# Patient Record
Sex: Male | Born: 1975 | Race: White | Hispanic: No | Marital: Married | Smoking: Current every day smoker
Health system: Southern US, Community
[De-identification: ages and names within clinical notes are randomized; demographics above are authoritative.]

---

## 2009-01-10 ENCOUNTER — Ambulatory Visit: Payer: Self-pay | Admitting: Diagnostic Radiology

## 2009-01-10 ENCOUNTER — Emergency Department (HOSPITAL_BASED_OUTPATIENT_CLINIC_OR_DEPARTMENT_OTHER): Admission: EM | Admit: 2009-01-10 | Discharge: 2009-01-10 | Payer: Self-pay | Admitting: Emergency Medicine

## 2009-04-26 ENCOUNTER — Ambulatory Visit: Payer: Self-pay | Admitting: Family Medicine

## 2009-04-26 DIAGNOSIS — K089 Disorder of teeth and supporting structures, unspecified: Secondary | ICD-10-CM | POA: Insufficient documentation

## 2009-08-07 ENCOUNTER — Ambulatory Visit: Payer: Self-pay | Admitting: Family Medicine

## 2009-08-07 DIAGNOSIS — M546 Pain in thoracic spine: Secondary | ICD-10-CM

## 2009-09-04 ENCOUNTER — Ambulatory Visit: Payer: Self-pay | Admitting: Family Medicine

## 2009-12-20 ENCOUNTER — Ambulatory Visit: Payer: Self-pay | Admitting: Family Medicine

## 2009-12-20 DIAGNOSIS — S9030XA Contusion of unspecified foot, initial encounter: Secondary | ICD-10-CM | POA: Insufficient documentation

## 2009-12-20 DIAGNOSIS — S99929A Unspecified injury of unspecified foot, initial encounter: Secondary | ICD-10-CM

## 2009-12-20 DIAGNOSIS — S8990XA Unspecified injury of unspecified lower leg, initial encounter: Secondary | ICD-10-CM

## 2009-12-20 DIAGNOSIS — S99919A Unspecified injury of unspecified ankle, initial encounter: Secondary | ICD-10-CM

## 2010-03-13 ENCOUNTER — Ambulatory Visit: Payer: Self-pay | Admitting: Family Medicine

## 2010-03-13 DIAGNOSIS — IMO0002 Reserved for concepts with insufficient information to code with codable children: Secondary | ICD-10-CM | POA: Insufficient documentation

## 2010-03-13 DIAGNOSIS — M25519 Pain in unspecified shoulder: Secondary | ICD-10-CM | POA: Insufficient documentation

## 2010-03-19 ENCOUNTER — Ambulatory Visit: Payer: Self-pay | Admitting: Family Medicine

## 2010-03-19 ENCOUNTER — Encounter (INDEPENDENT_AMBULATORY_CARE_PROVIDER_SITE_OTHER): Payer: Self-pay | Admitting: *Deleted

## 2010-03-19 DIAGNOSIS — M7580 Other shoulder lesions, unspecified shoulder: Secondary | ICD-10-CM

## 2010-05-21 ENCOUNTER — Ambulatory Visit: Payer: Self-pay | Admitting: Family Medicine

## 2010-05-21 DIAGNOSIS — IMO0002 Reserved for concepts with insufficient information to code with codable children: Secondary | ICD-10-CM

## 2010-05-23 ENCOUNTER — Telehealth (INDEPENDENT_AMBULATORY_CARE_PROVIDER_SITE_OTHER): Payer: Self-pay | Admitting: *Deleted

## 2010-10-08 NOTE — Progress Notes (Signed)
  Phone Note Outgoing Call   Call placed by: Lajean Saver RN,  May 23, 2010 4:52 PM Call placed to: Patient Action Taken: Phone Call Completed  Follow-up for Phone Call        Phone call completed Patient says he is feeling a lot better and has been doing ROM exercises from the handout. Follow-up by: Lajean Saver RN,  May 23, 2010 4:53 PM

## 2010-10-08 NOTE — Letter (Signed)
Summary: Out of Work  [facility]  [address]   [place], [place] [address]   Phone: [phone]  Fax: [phone]    March 19, 2010   Employee:  Noah Robertson    To Whom It May Concern:   For Medical reasons, please excuse the above named employee from work for the following dates:  Start:  March 19, 2010 2:45 PM   End:  March 23, 2010   If you need additional information, please feel free to contact our office.         Sincerely,    Lillia Pauls CMA

## 2010-10-08 NOTE — Assessment & Plan Note (Signed)
Summary: INJURY TO R FOOT/KH   Vital Signs:  Patient Profile:   35 Years Old Male CC:      injury to right foot today while moving vanity Height:     70 inches Weight:      161 pounds O2 Sat:      98 % O2 treatment:    Room Air Temp:     97.5 degrees F oral Pulse rate:   74 / minute Pulse rhythm:   regular Resp:     12 per minute BP sitting:   126 / 77  (right arm) Cuff size:   regular  Pt. in pain?   yes    Location:   right foot    Intensity:   8    Type:       dull  Vitals Entered By: Lajean Saver RN (December 20, 2009 3:40 PM)                   Updated Prior Medication List: No Medications Current Allergies: No known allergies History of Present Illness Chief Complaint: injury to right foot today while moving vanity History of Present Illness: DROPPED A VANITY ON THE TOP OF HIS RIGHT FOOT PTA. PAIN AND SWELLING. NO SELF TX.   REVIEW OF SYSTEMS Constitutional Symptoms      Denies fever, chills, night sweats, weight loss, weight gain, and fatigue.  Eyes       Denies change in vision, eye pain, eye discharge, glasses, contact lenses, and eye surgery. Ear/Nose/Throat/Mouth       Denies hearing loss/aids, change in hearing, ear pain, ear discharge, dizziness, frequent runny nose, frequent nose bleeds, sinus problems, sore throat, hoarseness, and tooth pain or bleeding.  Respiratory       Denies dry cough, productive cough, wheezing, shortness of breath, asthma, bronchitis, and emphysema/COPD.  Cardiovascular       Denies murmurs, chest pain, and tires easily with exhertion.    Gastrointestinal       Denies stomach pain, nausea/vomiting, diarrhea, constipation, blood in bowel movements, and indigestion. Genitourniary       Denies painful urination, kidney stones, and loss of urinary control. Neurological       Complains of tingling.      Denies paralysis, seizures, and fainting/blackouts.      Comments: right foot Musculoskeletal       Complains of muscle pain,  joint pain, joint stiffness, decreased range of motion, and redness.      Denies swelling, muscle weakness, and gout.      Comments: right foot Skin       Denies bruising, unusual mles/lumps or sores, and hair/skin or nail changes.  Psych       Denies mood changes, temper/anger issues, anxiety/stress, speech problems, depression, and sleep problems. Other Comments: Injury to right today. Moving vanity that fell onto foot. R foot is tingling and he has decreased sensation in first two toes   Past History:  Past Medical History: Reviewed history from 04/26/2009 and no changes required. Unremarkable  Past Surgical History: Reviewed history from 04/26/2009 and no changes required. Denies surgical history  Family History: Reviewed history from 04/26/2009 and no changes required. 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 NO DEFORMITY. BRUISING NOTED DORSUM OF THE MID FOOT. SENSORY INTACT. GOOD CAP REFILL. GOOD ROM  OF TOES ,FOOT AND ANKLE.  Skin: no obvious rashes or lesions XRAY NEG FOR FRACTURE Assessment New Problems: CONTUSION, RIGHT FOOT (ICD-924.20) FOOT INJURY, RIGHT (ICD-959.7)   Plan New Medications/Changes: TYLENOL WITH CODEINE #3 300-30 MG TABS (ACETAMINOPHEN-CODEINE) 1-2 by mouth Q 6 HRS as needed PAIN  ##20 x 0, 12/20/2009, Marvis Moeller DO  New Orders: T-DG Foot Complete*R* [73630] Est. Patient Level III [[address]]   Prescriptions: TYLENOL WITH CODEINE #3 300-30 MG TABS (ACETAMINOPHEN-CODEINE) 1-2 by mouth Q 6 HRS as needed PAIN  ##20 x 0   Entered and Authorized by:   Marvis Moeller DO   Signed by:   Marvis Moeller DO on 12/20/2009   Method used:   Print then Give to Patient   RxID:   [id number]   Patient Instructions: 1)  WEAR  ACE WRAP FOR 3-4 DAYS. ELEVATE A AND APPLY ICE. FOLLOW UP WITH YOUR PCP OR ORTHO IF SYMPTOMS PERSIST.

## 2010-10-08 NOTE — Letter (Signed)
Summary: Out of Work  [facility]  [address]   [place], [place] [address]   Phone: [phone]  Fax: [phone]    May 21, 2010   Employee:  Noah Robertson    To Whom It May Concern:   For Medical reasons, please excuse the above named employee from heavy lifting, pushing, pulling, and straining for one week.    If you need additional information, please feel free to contact our office.         Sincerely,    Donna Christen MD

## 2010-10-08 NOTE — Assessment & Plan Note (Signed)
Summary: BACK PAIN/KH Room 5   Vital Signs:  Patient Profile:   35 Years Old Male CC:      Lower rt back pain, bending over pulling on shoe yesterday am Height:     70 inches Weight:      150 pounds O2 Sat:      99 % O2 treatment:    Room Air Temp:     98.7 degrees F oral Pulse rate:   90 / minute Pulse rhythm:   regular Resp:     16 per minute BP sitting:   115 / 74  (right arm) Cuff size:   regular  Vitals Entered By: Emilio Math (May 21, 2010 9:58 AM)                  Current Allergies (reviewed today): No known allergies History of Present Illness Chief Complaint: Lower rt back pain, bending over pulling on shoe yesterday am History of Present Illness:  Subjective:  Patient states that he bent down yesterday to pull on his left shoe and felt a sudden sharp pain in his right posterior chest radiating to his left shoulder blade.  The pain has persisted, and is worse with movement and inspiration.  No shortness of breath.  No fevers, chills, and sweats.  The pain does not radiate.  It awakens him at night.  No shoulder pain.  REVIEW OF SYSTEMS Constitutional Symptoms      Denies fever, chills, night sweats, weight loss, weight gain, and fatigue.  Eyes       Denies change in vision, eye pain, eye discharge, glasses, contact lenses, and eye surgery. Ear/Nose/Throat/Mouth       Denies hearing loss/aids, change in hearing, ear pain, ear discharge, dizziness, frequent runny nose, frequent nose bleeds, sinus problems, sore throat, hoarseness, and tooth pain or bleeding.  Respiratory       Denies dry cough, productive cough, wheezing, shortness of breath, asthma, bronchitis, and emphysema/COPD.  Cardiovascular       Denies murmurs, chest pain, and tires easily with exhertion.    Gastrointestinal       Denies stomach pain, nausea/vomiting, diarrhea, constipation, blood in bowel movements, and indigestion. Genitourniary       Denies painful urination, kidney stones,  and loss of urinary control. Neurological       Denies paralysis, seizures, and fainting/blackouts. Musculoskeletal       Complains of muscle pain, joint stiffness, and decreased range of motion.      Denies joint pain, redness, swelling, muscle weakness, and gout.  Skin       Denies bruising, unusual mles/lumps or sores, and hair/skin or nail changes.  Psych       Denies mood changes, temper/anger issues, anxiety/stress, speech problems, depression, and sleep problems.  Past History:  Past Medical History: Reviewed history from 03/19/2010 and no changes required. remote history of hand fracture  Past Surgical History: Reviewed history from 04/26/2009 and no changes required. Denies surgical history  Family History: Reviewed history from 04/26/2009 and no changes required. noncontributory  Social History: Reviewed history from 12/20/2009 and no changes required. 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  Lungs:  Clear to auscultation.  Breath sounds are equal.  Heart:  Regular rate and rhythm without murmurs, rubs, or gallops.  Abdomen:  Nontender without masses  or hepatosplenomegaly.  Bowel sounds are present.  No CVA or flank tenderness.  Posterior chest:  Diffuse mild tenderness right posterior/lateral ribs extending to left posterior ribs and along medial edge of left scapula.  No distinct point tenderness.  No lower back tenderness  Assessment New Problems: STRAIN, CHEST WALL (ICD-848.8)  STRAIN OF LATISSIMUS DORSI MUSCLES (BILATERAL) AND LEFT RHOMBOID STRAIN  Plan New Medications/Changes: LORTAB 5 5-500 MG TABS (HYDROCODONE-ACETAMINOPHEN) One by mouth hs as needed pain  #8 (eight) x 0, 05/21/2010, Donna Christen MD CYCLOBENZAPRINE HCL 10 MG TABS (CYCLOBENZAPRINE HCL) One tab by mouth two to three  times daily as needed  #15 x 1, 05/21/2010, Donna Christen MD NAPROXEN 500 MG TABS (NAPROXEN) One by mouth two times a [place] pc  #20 x 1, 05/21/2010, Donna Christen MD  New Orders: Est. Patient Level III [phone] Planning Comments:   Begin Naprosyn, muscle relaxant, and analgesic at bedtime.  Begin applying ice pack several times daily.  Begin range of motion exercises in about 5 days ([employer] information and instruction patient handout given)  No heavy lifting or strenuous activity for one week. Follow-up with PCP if not improving 10 to 14 days.   The patient and/or caregiver has been counseled thoroughly with regard to medications prescribed including dosage, schedule, interactions, rationale for use, and possible side effects and they verbalize understanding.  Diagnoses and expected course of recovery discussed and will return if not improved as expected or if the condition worsens. Patient and/or caregiver verbalized understanding.  Prescriptions: LORTAB 5 5-500 MG TABS (HYDROCODONE-ACETAMINOPHEN) One by mouth hs as needed pain  #8 (eight) x 0   Entered and Authorized by:   Donna Christen MD   Signed by:   Donna Christen MD on 05/21/2010   Method used:   Print then Give to Patient   RxID:   [id number] CYCLOBENZAPRINE HCL 10 MG TABS (CYCLOBENZAPRINE HCL) One tab by mouth two to three times daily as needed  #15 x 1   Entered and Authorized by:   Donna Christen MD   Signed by:   Donna Christen MD on 05/21/2010   Method used:   Print then Give to Patient   RxID:   [id number] NAPROXEN 500 MG TABS (NAPROXEN) One by mouth two times a [place] pc  #20 x 1   Entered and Authorized by:   Donna Christen MD   Signed by:   Donna Christen MD on 05/21/2010   Method used:   Print then Give to Patient   RxID:   [id number]   Orders Added: 1)  Est. Patient Level III [[address]]

## 2010-10-08 NOTE — Letter (Signed)
Summary: Out of Work  [facility]  [address]   [place], [place] [address]   Phone: [phone]  Fax: [phone]    March 13, 2010   Employee:  Noah Robertson    To Whom It May Concern:   For Medical reasons, the above named employee should not use his left arm/shoulder for one week, and wear a sling while at work.    If you need additional information, please feel free to contact our office.         Sincerely,    Donna Christen MD

## 2010-10-08 NOTE — Assessment & Plan Note (Signed)
Summary: LEFT SHOULDER ROTAR CUFF INJURY/MJD   Vital Signs:  Patient profile:   35 year old male Height:      70 inches Weight:      150 pounds BMI:     21.60 BP sitting:   128 / 88  Vitals Entered By: Lillia Pauls CMA (March 19, 2010 1:56 PM)  History of Present Illness: the patient is a pleasant 35 year old white male who presents after initial injury to his left shoulder, seen in consultation for Dr. Cathren Harsh. The patient reports this occurred while he was working and moving for [employer], and subsequently was assisting in moving some equipment, and his partner lost his handle on the movement, and he had a downward and outward motion causing deceleration with his left upper extremity.  He did not have a great deal of pain at the initial incident, and had a dull ache and some pain for about a week after this. Since that time, he is continued to have a dull aching pain in his upper extremity, and has noted that this was much worse over the last week or so. He did go in for evaluation on March 13, 2010. initial injury was approximately 3 weeks prior this.  He has had pain with abduction, pain with internal rotation. Does feel strong blows nose, and is actually able easily chip a golf ball. He does have pain at the long drives on the golf course. He has no pain with putting a ball. He does primarily complain of lateral shoulder pain and deep pain at the lateral anterior shoulder.  No prior history of dislocation event or prior traumatic fracture.  Allergies: No Known Drug Allergies  Past History:  Past medical, surgical, family and social histories (including risk factors) reviewed, and no changes noted (except as noted below).  Past Medical History: remote history of hand fracture  Past Surgical History: Reviewed history from 04/26/2009 and no changes required. Denies surgical history  Family History: Reviewed history from 04/26/2009 and no changes required. noncontributory  Social  History: Reviewed history from 12/20/2009 and no changes required. 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 Otherwise the pertinent positives of the ROS are noted above.    Physical Exam  General:  GEN: Well-developed,well-nourished,in no acute distress; alert,appropriate and cooperative throughout examination HEENT: Normocephalic and atraumatic without obvious abnormalities. No apparent alopecia or balding. Ears, externally no deformities PULM: Breathing comfortably in no respiratory distress EXT: No clubbing, cyanosis, or edema PSYCH: Normally interactive. Cooperative during the interview. Pleasant. Friendly and conversant. Not anxious or depressed appearing. Normal, full affect.  Msk:  less shoulder: Full range of motion without any mechanical block in all planes.  Strength testing of abduction is 4+/5, external rotation is 4+/5, internal rotation 5/5, and all of the strength testing is 5/5 with an excellent grip strength.  Negative drop test. There is no elevation of the scapula in abduction.  Painful arc of motion. Positive Jobe test. Mildly positive a.c. crossover compression test. Positive Neer test. Positive Leanord Asal test.  Minimally positive speed test with a negative Yergason's test. Negative O'Brien's test.  Nontender longer clavicle with minimal tenderness at the a.c. joint and some mild tenderness at the supraspinatus insertion.  Impression & Recommendations:  Problem # 1:  SHOULDER IMPINGEMENT SYNDROME, LEFT (ICD-726.2)  clinically not consistent with a full-thickness rotator cuff tear. I think that the patient likely had a small partial tear of the  infraspinatus with possibly minimal tear the supraspinatus, however function is intact. The vast majority of these will do well with conservative management.  I can initiate rotator cuff strengthening and scapular stabilization for biomechanical control and improvement.  Also will plan to do a subacromial injection for pain control and to help initiate therapy now.  I did keep him out of work until Saturday, so that he can get more pain free after his injection and start doing some rehabilitation and improved strengthening prior to returning to work.  cc: Dr. Cathren Harsh, I appreciate the opportunity to care for this very nice gentleman.  SubAC Injection, L Verbal consent was obtained from the patient. Risks, benefits, and alternatives were explained. Patient prepped with Betadine and Ethyl Chloride used for anesthesia. The subacromial space was injected using the posterior approach. The patient tolerated the procedure well and had decreased pain post injection. No complications. Injection: 9 cc of Lidocaine 1% and 1cc of Kenalog 40 mg. Needle: 22 gauge   Orders: Joint Aspirate / Injection, Large (20610)  Problem # 2:  SHOULDER STRAIN, LEFT (ICD-840.9)  Orders: Kenalog 10 mg inj (J3301) Theraband per yard (A9300) Joint Aspirate / Injection, Large (20610)  Problem # 3:  SHOULDER PAIN, LEFT (ICD-719.41)  His updated medication list for this problem includes:    Naproxen 500 Mg Tabs (Naproxen) ..... One by mouth two times a [place] pc    Lortab 5 5-500 Mg Tabs (Hydrocodone-acetaminophen) ..... One or two tabs by mouth hs as needed pain  Orders: Kenalog 10 mg inj (J3301) Theraband per yard (A9300) Joint Aspirate / Injection, Large (20610)  Complete Medication List: 1)  Naproxen 500 Mg Tabs (Naproxen) .... One by mouth two times a [place] pc 2)  Lortab 5 5-500 Mg Tabs (Hydrocodone-acetaminophen) .... One or two tabs by mouth hs as needed pain  Patient Instructions: 1)  f/u 1 month

## 2010-10-08 NOTE — Assessment & Plan Note (Signed)
Summary: left shoulder pain   Vital Signs:  Patient Profile:   35 Years Old Male CC:      left shoulder pain post injury 3 weeks ago Height:     70 inches Weight:      147 pounds O2 Sat:      100 % O2 treatment:    Room Air Temp:     97.1 degrees F oral Pulse rate:   86 / minute Resp:     14 per minute BP sitting:   100 / 741  (right arm) Cuff size:   regular  Pt. in pain?   yes    Location:   left shoulder    Intensity:   7    Type:       sharp/dull  Vitals Entered By: Lajean Saver RN (March 13, 2010 9:43 AM)                   Updated Prior Medication List: No Medications Current Allergies (reviewed today): No known allergies History of Present Illness Chief Complaint: left shoulder pain post injury 3 weeks ago History of Present Illness:  Subjective:  Patient complains of onset of sudden onset of left shoulder pain 3 weeks ago while lifting a front-loading clothes washing machine.  His helper dropped his side, and patient was left supporting all the weight of the machine.  Gaylyn Rong has had persistent pain, worse at night, and limited range of motion.  He also had tingling in his left fingers for several days, now resolved.  REVIEW OF SYSTEMS Constitutional Symptoms      Denies fever, chills, night sweats, weight loss, weight gain, and fatigue.  Eyes       Denies change in vision, eye pain, eye discharge, glasses, contact lenses, and eye surgery. Ear/Nose/Throat/Mouth       Denies hearing loss/aids, change in hearing, ear pain, ear discharge, dizziness, frequent runny nose, frequent nose bleeds, sinus problems, sore throat, hoarseness, and tooth pain or bleeding.  Respiratory       Denies dry cough, productive cough, wheezing, shortness of breath, asthma, bronchitis, and emphysema/COPD.  Cardiovascular       Denies murmurs, chest pain, and tires easily with exhertion.    Gastrointestinal       Denies stomach pain, nausea/vomiting, diarrhea, constipation, blood in bowel  movements, and indigestion. Genitourniary       Denies painful urination, kidney stones, and loss of urinary control. Neurological       Denies paralysis, seizures, and fainting/blackouts. Musculoskeletal       Complains of muscle pain, joint pain, joint stiffness, and decreased range of motion.      Denies redness, swelling, muscle weakness, and gout.      Comments: left shoulder Skin       Denies bruising, unusual mles/lumps or sores, and hair/skin or nail changes.  Psych       Denies mood changes, temper/anger issues, anxiety/stress, speech problems, depression, and sleep problems. Other Comments: patinet feels her "over stretched" his left shoulder moving a washing machine 3 weeks ago. He c/o intermittent pain, increasing with work. Decreased ROM, weakness   Past History:  Past Medical History: Reviewed history from 04/26/2009 and no changes required. Unremarkable  Past Surgical History: Reviewed history from 04/26/2009 and no changes required. Denies surgical history  Family History: Reviewed history from 04/26/2009 and no changes required. noncontributory  Social History: Reviewed history from 12/20/2009 and no changes required. 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  Has difficulty reaching behind back and cannot abduct above horizontal either actively or passively.  No deformity.  There is mild tenderness over AC joint and distal acromion.  Mild tenderness over insertion of biceps tendons.  Distal neurovascular intact  X-ray left shoulder:  negative X-ray left AC joint with and without weights:  negative Assessment New Problems: SHOULDER STRAIN, LEFT (ICD-840.9) SHOULDER PAIN, LEFT (ICD-719.41)  SUSPECT A ROTATOR CUFF  INJURY  Plan New Medications/Changes: LORTAB 5 5-500 MG TABS (HYDROCODONE-ACETAMINOPHEN) One or two tabs by mouth hs as needed pain  #12 (twelve) x 0, 03/13/2010, Donna Christen MD NAPROXEN 500 MG TABS (NAPROXEN) One by mouth two times a [place] pc  #20 x 1, 03/13/2010, Donna Christen MD  New Orders: T-DG Shoulder*L* [73030] T-DG AC Joints [73050] Slings- All Types [A4565] Est. Patient Level IV [14782] Planning Comments:   Will refer to the [facility] as soon as possible  Wear sling.  Apply ice pack several times daily.  NSAID, analgesic at bedtime.  Instructed in pendulum exercises until seen by sports med clinic.   The patient and/or caregiver has been counseled thoroughly with regard to medications prescribed including dosage, schedule, interactions, rationale for use, and possible side effects and they verbalize understanding.  Diagnoses and expected course of recovery discussed and will return if not improved as expected or if the condition worsens. Patient and/or caregiver verbalized understanding.  Prescriptions: LORTAB 5 5-500 MG TABS (HYDROCODONE-ACETAMINOPHEN) One or two tabs by mouth hs as needed pain  #12 (twelve) x 0   Entered and Authorized by:   Donna Christen MD   Signed by:   Donna Christen MD on 03/13/2010   Method used:   Print then Give to Patient   RxID:   [id number] NAPROXEN 500 MG TABS (NAPROXEN) One by mouth two times a [place] pc  #20 x 1   Entered and Authorized by:   Donna Christen MD   Signed by:   Donna Christen MD on 03/13/2010   Method used:   Print then Give to Patient   RxID:   [id number]   Orders Added: 1)  T-DG Shoulder*L* [73030] 2)  T-DG AC Joints [73050] 3)  Slings- All Types [A4565] 4)  Est. Patient Level IV [25366]

## 2012-08-09 ENCOUNTER — Emergency Department (INDEPENDENT_AMBULATORY_CARE_PROVIDER_SITE_OTHER): Payer: Self-pay

## 2012-08-09 ENCOUNTER — Encounter: Payer: Self-pay | Admitting: Emergency Medicine

## 2012-08-09 ENCOUNTER — Emergency Department
Admission: EM | Admit: 2012-08-09 | Discharge: 2012-08-09 | Disposition: A | Discharge status: Home / Self Care | Payer: Self-pay | Source: Home / Self Care | Attending: Family Medicine | Admitting: Family Medicine

## 2012-08-09 DIAGNOSIS — S2232XA Fracture of one rib, left side, initial encounter for closed fracture: Secondary | ICD-10-CM

## 2012-08-09 DIAGNOSIS — S2239XA Fracture of one rib, unspecified side, initial encounter for closed fracture: Secondary | ICD-10-CM

## 2012-08-09 DIAGNOSIS — W19XXXA Unspecified fall, initial encounter: Secondary | ICD-10-CM

## 2012-08-09 DIAGNOSIS — R079 Chest pain, unspecified: Secondary | ICD-10-CM

## 2012-08-09 DIAGNOSIS — S298XXA Other specified injuries of thorax, initial encounter: Secondary | ICD-10-CM

## 2012-08-09 MED ORDER — OXYCODONE-ACETAMINOPHEN 5-325 MG PO TABS: ORAL_TABLET | ORAL | Status: DC

## 2012-08-09 NOTE — ED Provider Notes (Signed)
History     CSN: [id number]  Arrival date & time 08/09/12  1122   First MD Initiated Contact with Patient 08/09/12 1214      Chief Complaint  Patient presents with  . Flank Pain      HPI Comments: Patient complains of left mid back rib pain from fall last night. Slipped and hit the corner of couch with left mid back ribs, unable to sleep at all, very painful.  He denies shortness of breath   Patient is a 36 y.o. male presenting with chest pain. The history is provided by the patient.  Chest Pain The chest pain began yesterday. Chest pain occurs frequently. The chest pain is unchanged. The pain is associated with breathing. At its most intense, the pain is at 10/10. The severity of the pain is moderate. The quality of the pain is described as sharp. The pain does not radiate. Chest pain is worsened by deep breathing. Pertinent negatives for primary symptoms include no fever, no fatigue, no syncope, no shortness of breath, no cough, no wheezing, no palpitations, no abdominal pain, no nausea and no vomiting.     History reviewed. No pertinent past medical history.  History reviewed. No pertinent past surgical history.  Family History  Problem Relation Age of Onset  . Heart disease Father     History  Substance Use Topics  . Smoking status: Current Every [place] Smoker -- 0.5 packs/[place] for 5 years  . Smokeless tobacco: Not on file  . Alcohol Use: Yes      Review of Systems  Constitutional: Negative for fever and fatigue.  Respiratory: Negative for cough, shortness of breath and wheezing.   Cardiovascular: Positive for chest pain. Negative for palpitations and syncope.  Gastrointestinal: Negative for nausea, vomiting and abdominal pain.  All other systems reviewed and are negative.    Allergies  Review of patient's allergies indicates no known allergies.  Home Medications   Current Outpatient Rx  Name  Route  Sig  Dispense  Refill  . OXYCODONE-ACETAMINOPHEN 5-325 MG PO  TABS      Take one by mouth HS prn pain   10 tablet   0     BP 99/61  Pulse 83  Temp 97.9 F (36.6 C) (Oral)  Resp 18  Ht 5\' 10"  (1.778 m)  Wt 146 lb (66.225 kg)  BMI 20.95 kg/m2  SpO2 99%  Physical Exam  Pulmonary/Chest:         There is tenderness over left posterior ribs as noted on diagram  Nursing notes and Vital Signs reviewed. Appearance:  Patient appears healthy, stated age, and in no acute distress Eyes:  Pupils are equal, round, and reactive to light and accomodation.  Extraocular movement is intact.  Conjunctivae are not inflamed   Pharynx:  Normal Neck:  Supple.  No adenopathy Lungs:  Clear to auscultation.  Breath sounds are equal.  Heart:  Regular rate and rhythm without murmurs, rubs, or gallops.  Abdomen:  Nontender without masses or hepatosplenomegaly.  Bowel sounds are present.  No CVA or flank tenderness.  Extremities:  No edema.  No calf tenderness Skin:  No rash present.    ED Course  Procedures  none   Dg Ribs Unilateral W/chest Left  08/09/2012  *RADIOLOGY REPORT*  Clinical Data: Fall with left chest injury and rib pain.  LEFT RIBS AND CHEST - 3+ VIEW  Comparison: None.  Findings: A subtle fracture is suspected involving the posterior aspect of the left ninth  rib.  No associated pneumothorax or pulmonary consolidation.  The heart size and mediastinal contours are within normal limits.  IMPRESSION: Suspect subtle left posterior 9th rib fracture.   Original Report Authenticated By: Irish Lack, M.D.      1. Fracture of rib of left side       MDM  Rib belt applied. Rx for Percocet at bedtime.  Apply ice pack several times daily. Red flags discussed (Return for increasing shortness of breath, fever, etc)        Lattie Haw, MD 08/10/12 1527

## 2012-08-09 NOTE — ED Notes (Signed)
Left mid back rib pain from fall last night. Slipped and hit the corner of couch with left mid back ribs, unable to sleep at all, very painful.

## 2012-08-14 ENCOUNTER — Telehealth: Payer: Self-pay | Admitting: Emergency Medicine

## 2013-04-18 ENCOUNTER — Emergency Department (INDEPENDENT_AMBULATORY_CARE_PROVIDER_SITE_OTHER)
Admission: EM | Admit: 2013-04-18 | Discharge: 2013-04-18 | Disposition: A | Discharge status: Home / Self Care | Payer: BC Managed Care – PPO | Source: Home / Self Care | Attending: Family Medicine | Admitting: Family Medicine

## 2013-04-18 ENCOUNTER — Emergency Department (INDEPENDENT_AMBULATORY_CARE_PROVIDER_SITE_OTHER): Payer: BC Managed Care – PPO

## 2013-04-18 ENCOUNTER — Encounter: Payer: Self-pay | Admitting: Emergency Medicine

## 2013-04-18 DIAGNOSIS — M5030 Other cervical disc degeneration, unspecified cervical region: Secondary | ICD-10-CM

## 2013-04-18 DIAGNOSIS — M4120 Other idiopathic scoliosis, site unspecified: Secondary | ICD-10-CM

## 2013-04-18 DIAGNOSIS — S20229A Contusion of unspecified back wall of thorax, initial encounter: Secondary | ICD-10-CM

## 2013-04-18 DIAGNOSIS — M5412 Radiculopathy, cervical region: Secondary | ICD-10-CM

## 2013-04-18 DIAGNOSIS — S20221A Contusion of right back wall of thorax, initial encounter: Secondary | ICD-10-CM

## 2013-04-18 DIAGNOSIS — S239XXA Sprain of unspecified parts of thorax, initial encounter: Secondary | ICD-10-CM

## 2013-04-18 DIAGNOSIS — M5380 Other specified dorsopathies, site unspecified: Secondary | ICD-10-CM

## 2013-04-18 DIAGNOSIS — S29012A Strain of muscle and tendon of back wall of thorax, initial encounter: Secondary | ICD-10-CM

## 2013-04-18 MED ORDER — HYDROCODONE-ACETAMINOPHEN 5-325 MG PO TABS: ORAL_TABLET | ORAL | Status: DC

## 2013-04-18 MED ORDER — PREDNISONE 20 MG PO TABS: 20.0000 mg | ORAL_TABLET | Freq: Two times a day (BID) | ORAL | Status: DC

## 2013-04-18 NOTE — ED Provider Notes (Signed)
CSN: [id number]     Arrival date & time 04/18/13  1349 History     First MD Initiated Contact with Patient 04/18/13 1357     Chief Complaint  Patient presents with  . Back Pain      HPI Comments: Patient was holding a 36-year old child in his arms 5 days ago.  He slipped backwards, striking the edge of a couch with his right shoulder before landing on floor.  He has had persistent right upper pain around his scapula.  He has developed persistent paresthesia in his right hand when he extends his right arm forward, and has difficulty manipulating ViseGrips, and other tools.  He denies neck pain.  Patient is a 37 y.o. male presenting with back pain. The history is provided by the patient.  Back Pain Location:  Thoracic spine Quality:  Aching Radiates to:  Does not radiate Pain severity:  Moderate Pain is:  Worse during the [place] Onset quality:  Sudden Duration:  5 days Timing:  Constant Progression:  Worsening Chronicity:  New Context: falling   Relieved by:  Nothing Exacerbated by: extending his right arm forward. Ineffective treatments:  None tried Associated symptoms: numbness, paresthesias, tingling and weakness   Associated symptoms: no chest pain and no headaches     No past medical history   No past surgical history    Family History  Problem Relation Age of Onset  . Heart disease Father    History  Substance Use Topics  . Smoking status: Current Every Helle Smoker -- 0.50 packs/Player for 5 years  . Smokeless tobacco: Not on file  . Alcohol Use: Yes    Review of Systems  Cardiovascular: Negative for chest pain.  Musculoskeletal: Positive for back pain.  Neurological: Positive for tingling, weakness, numbness and paresthesias. Negative for headaches.  All other systems reviewed and are negative.    Allergies  Review of patient's allergies indicates no known allergies.  Home Medications   Current Outpatient Rx  Name  Route  Sig  Dispense  Refill  .  HYDROcodone-acetaminophen (NORCO/VICODIN) 5-325 MG per tablet      Take one by mouth at bedtime as needed for pain   10 tablet   0   . oxyCODONE-acetaminophen (ROXICET) 5-325 MG per tablet      Take one by mouth HS prn pain   10 tablet   0   . predniSONE (DELTASONE) 20 MG tablet   Oral   Take 1 tablet (20 mg total) by mouth 2 (two) times daily.   10 tablet   0    BP 110/73  Pulse 83  Temp(Src) 98.2 F (36.8 C) (Oral)  Ht 5\' 10"  (1.778 m)  Wt 141 lb (63.957 kg)  BMI 20.23 kg/m2  SpO2 98% Physical Exam  Nursing note and vitals reviewed. Constitutional: He is oriented to person, place, and time. He appears well-developed and well-nourished. No distress.  HENT:  Head: Atraumatic.  Eyes: Conjunctivae are normal. Pupils are equal, round, and reactive to light.  Neck: Normal range of motion. Neck supple.  Cardiovascular: Normal heart sounds.   Pulmonary/Chest: Breath sounds normal.  Musculoskeletal:       Cervical back: He exhibits tenderness and bony tenderness. He exhibits no swelling, no edema and no deformity.       Thoracic back: He exhibits tenderness and bony tenderness. He exhibits no swelling and no edema.       Back:  Upper back and neck reveal tenderness in the midline  thoracic spine and inferior cervical spine as noted on diagram. There is distinct tenderness over medial and inferior edges of right scapula.  Pain elicited by resisted abduction of left shoulder while palpating left rhomboid muscles.  Sensation in the right hand is slightly decreased to light touch. Right shoulder is nontender and has relatively good range of motion   Lymphadenopathy:    He has no cervical adenopathy.  Neurological: He is alert and oriented to person, place, and time.  Skin: Skin is warm and dry. No rash noted.    ED Course   Procedures  none   Dg Cervical Spine Complete  04/18/2013   *RADIOLOGY REPORT*  Clinical Data: Pain post trauma  CERVICAL SPINE - COMPLETE 4+ VIEW   Comparison: None.  Findings: Frontal, lateral, open mouth odontoid, and bilateral oblique views were obtained. There is no fracture or spondylolisthesis.  Prevertebral soft tissues and predental space regions are normal.  There is moderate disc space narrowing at C6-7. Other disc spaces appear normal.  There are posterior osteophytes at C6 and C7. There is exit foraminal narrowing at the C6-7 on the right due to bony hypertrophy.     IMPRESSION: Prominent osteophytes at C6-7 on the right with disc narrowing at C6-7.  No fracture or spondylolisthesis.   Original Report Authenticated By: Bretta Bang, M.D.   Dg Thoracic Spine 2 View  04/18/2013   *RADIOLOGY REPORT*  Clinical Data: Pain post trauma  THORACIC SPINE - 3 VIEW  Comparison: No  Findings:  Frontal, lateral, and swimmer's views were obtained. There is thoracolumbar levoscoliosis.  There is no fracture or spondylolisthesis.  There is disc space narrowing in the lower cervical spine at several levels.  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 No fracture or spondylolisthesis.   Original Report Authenticated By: Bretta Bang, M.D.   1. Rhomboid muscle strain, initial encounter   2. Contusion of upper back excluding scapular region, right, initial encounter   3. Cervical radiculopathy at C6     MDM  Begin prednisone burst.  Rx for Lortab at bedtime. Apply ice pack to upper back 3 to 4 times daily until improved.  Begin exercises for rhomboid muscles as per instruction sheet (but avoid all exercises involving extension of the neck) ([facility] information and instruction handout given)  Followup with neurologist if not improving about 10 to 14 days.  Lattie Haw, MD 04/18/13 1536

## 2013-04-18 NOTE — ED Notes (Signed)
Patient C/O of rt Scapula pain from slipping on a toy while holding a child and falling hitting his back on corner of couch x 6 days ago.  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

## 2014-03-01 ENCOUNTER — Emergency Department (INDEPENDENT_AMBULATORY_CARE_PROVIDER_SITE_OTHER)
Admission: EM | Admit: 2014-03-01 | Discharge: 2014-03-01 | Disposition: A | Discharge status: Home / Self Care | Payer: BC Managed Care – PPO | Source: Home / Self Care | Attending: Family Medicine | Admitting: Family Medicine

## 2014-03-01 ENCOUNTER — Telehealth: Payer: Self-pay | Admitting: *Deleted

## 2014-03-01 ENCOUNTER — Ambulatory Visit (INDEPENDENT_AMBULATORY_CARE_PROVIDER_SITE_OTHER): Payer: BC Managed Care – PPO | Admitting: Sports Medicine

## 2014-03-01 ENCOUNTER — Encounter: Payer: Self-pay | Admitting: Emergency Medicine

## 2014-03-01 DIAGNOSIS — S71009A Unspecified open wound, unspecified hip, initial encounter: Secondary | ICD-10-CM

## 2014-03-01 DIAGNOSIS — S71109A Unspecified open wound, unspecified thigh, initial encounter: Secondary | ICD-10-CM

## 2014-03-01 DIAGNOSIS — S71132D Puncture wound without foreign body, left thigh, subsequent encounter: Secondary | ICD-10-CM

## 2014-03-01 DIAGNOSIS — S71112A Laceration without foreign body, left thigh, initial encounter: Secondary | ICD-10-CM | POA: Insufficient documentation

## 2014-03-01 DIAGNOSIS — Z299 Encounter for prophylactic measures, unspecified: Secondary | ICD-10-CM | POA: Insufficient documentation

## 2014-03-01 MED ORDER — CEPHALEXIN 500 MG PO CAPS: 500.0000 mg | ORAL_CAPSULE | Freq: Three times a day (TID) | ORAL | Status: DC

## 2014-03-01 MED ORDER — MELOXICAM 15 MG PO TABS: ORAL_TABLET | ORAL | Status: AC

## 2014-03-01 MED ORDER — METRONIDAZOLE 500 MG PO TABS: 500.0000 mg | ORAL_TABLET | Freq: Two times a day (BID) | ORAL | Status: AC

## 2014-03-01 MED ORDER — OXYCODONE HCL 7.5 MG PO TABS: 1.0000 | ORAL_TABLET | Freq: Three times a day (TID) | ORAL | Status: DC | PRN

## 2014-03-01 NOTE — Assessment & Plan Note (Signed)
Noah SalinaBenjamin does desire to establish primary care, we will do this at the next visit.

## 2014-03-01 NOTE — Assessment & Plan Note (Addendum)
Occurred 5 days ago. CT angiogram of the lower extremity was normal, there was some gas in the adductor longus muscle. He is in significant pain, oxycodone 5 mg for use 3 times a Salvas, Mobic. Strap of compressive dressing. Considering gas pockets we do need to consider anaerobic coverage, on further review of his chart he is already on Keflex which is appropriate coverage for aerobes, I will add metronidazole for additional coverage of anaerobes. Return visit next week for nurse visit to remove stitches, and then in 2 weeks to see me.

## 2014-03-01 NOTE — Progress Notes (Signed)
   Subjective:    I'm seeing this patient as a consultation for:  Dr. Cathren HarshBeese  CC: Left leg stab wound  HPI: This is a pleasant 38 year old male, he played [facility] [employer] in [place], proximally 5 days ago he accidentally stabbed himself in the left medial thigh. The entirety of a 4 inch blade went into the thigh, he pulled it out and help pressure on the bleeding. He went immediately to the emergency department where a CT angiogram was negative for any vascular injury, all that was noted was some gas in the adductor muscle. On further review of his chart it seems as though he was prescribed Keflex.  Past medical history, Surgical history, Family history not pertinant except as noted below, Social history, Allergies, and medications have been entered into the medical record, reviewed, and no changes needed.   Review of Systems: No headache, visual changes, nausea, vomiting, diarrhea, constipation, dizziness, abdominal pain, skin rash, fevers, chills, night sweats, weight loss, swollen lymph nodes, body aches, joint swelling, muscle aches, chest pain, shortness of breath, mood changes, visual or auditory hallucinations.   Objective:   General: Well Developed, well nourished, and in no acute distress.  Neuro/Psych: Alert and oriented x3, extra-ocular muscles intact, able to move all 4 extremities, sensation grossly intact. Skin: Warm and dry, no rashes noted.  Respiratory: Not using accessory muscles, speaking in full sentences, trachea midline.  Cardiovascular: Pulses palpable, no extremity edema. Abdomen: Does not appear distended. Left thigh: There is a well approximated, sutured, 1.5 inch laceration without erythema, induration, or drainage. There is significant tenderness to palpation along the adductor longus, without palpable masses, or warmth. I am able to reproduce his pain with resisted abduction and internal straight leg raise.  CT angiogram of the left leg  from [facility]  TECHNIQUE: CT arteriogram of the lower extremities. Intra-axial acquisition performed after administration of 100 cc Isovue-370. No comparison. Sagittal and coronal reconstructions evaluated on the workstation.  INDICATION: Stab wound to the left upper leg.  FINDINGS: A relatively small soft tissue defect is present in the medial left thigh with a small area of subcutaneous soft tissue density visible on image 132. There also several small air collections in the adductor longus. Most are quite superficial  but one or 2 are fairly deep. There is no evidence of injury to any significant arterial or venous structures. The closest major vessels to the site of injury of the superficial femoral artery and vein. Imaged vascular structures are all widely patent.  There are no other bony or soft tissue injuries. Visible pelvic organs are normal.  IMPRESSION: Soft tissue injury to the medial mid left thigh with small gas pockets in the adductor longus. No evidence of significant vascular injury.   Impression and Recommendations:   This case required medical decision making of moderate complexity.

## 2014-03-01 NOTE — ED Provider Notes (Signed)
CSN: [id number]     Arrival date & time 03/01/14  1022 History   First MD Initiated Contact with Patient 03/01/14 1048     Chief Complaint  Patient presents with  . Wound Check      HPI Comments: While opening a container with a pocket knife five days ago, patient accidentally stabbed his left upper inner thigh, penetrating to a depth of about two inches.  [facility] CT angio showed no vascular injury.  The CT did show small gas pockets in the adductor longus.  His wound was closed with sutures.  He was prescribed cephalexin, and oxycodone for pain.  He complains of continued pain in his left upper medial thigh, radiating to the left groin.  The pain is worse with adduction of his left thigh, and walking.  The pain persists despite taking oxycodone.  No drainage from his wound.  No fevers, chills, and sweats.  He feels well otherwise.  Patient is a 38 y.o. male presenting with wound check. The history is provided by the patient.  Wound Check This is a new problem. Episode onset: 5 days ago. The problem has not changed since onset.Pertinent negatives include no abdominal pain. Associated symptoms comments: Left groin pain. The symptoms are aggravated by walking. The symptoms are relieved by narcotics. He has tried nothing for the symptoms.    History reviewed. No pertinent past medical history. History reviewed. No pertinent past surgical history. Family History  Problem Relation Age of Onset  . Heart disease Father    History  Substance Use Topics  . Smoking status: Current Every Gr[place] Smoker -- 0.50 packs/[place] for 5 years  . Smokeless tobacco: Not on file  . Alcohol Use: No    Review of Systems  Constitutional: Negative for fever, chills and fatigue.  Gastrointestinal: Negative for abdominal pain.  All other systems reviewed and are negative.   Allergies  Review of patient's allergies indicates no known allergies.  Home Medications     Prior to Admission medications   Medication Sig Start Date End Date Taking? Authorizing Provider  UNKNOWN TO PATIENT    Yes Historical Provider, MD  cephALEXin (KEFLEX) 500 MG capsule Take 1 capsule (500 mg total) by mouth 3 (three) times daily. 03/01/14   Monica Bectonhomas J Thekkekandam, MD  HYDROcodone-acetaminophen (NORCO/VICODIN) 5-325 MG per tablet Take one by mouth at bedtime as needed for pain 04/18/13   Lattie HawStephen A Beese, MD  meloxicam (MOBIC) 15 MG tablet One tab PO qAM with breakfast for 2 weeks, then daily prn pain. 03/01/14   Monica Bectonhomas J Thekkekandam, MD  OxyCODONE HCl 7.5 MG TABA Take 1 tablet by mouth every 8 (eight) hours as needed. 03/01/14   Monica Bectonhomas J Thekkekandam, MD  predniSONE (DELTASONE) 20 MG tablet Take 1 tablet (20 mg total) by mouth 2 (two) times daily. 04/18/13   Lattie HawStephen A Beese, MD   BP 110/69  Pulse 70  Temp(Src) 97.6 F (36.4 C) (Oral)  Resp 18  SpO2 99% Physical Exam  Nursing note and vitals reviewed. Constitutional: He is oriented to person, place, and time. He appears well-developed and well-nourished. No distress.  HENT:  Head: Atraumatic.  Eyes: Conjunctivae are normal. Pupils are equal, round, and reactive to light.  Abdominal: There is no tenderness.  Musculoskeletal:       Left upper leg: He exhibits tenderness and laceration. He exhibits no swelling and no edema.       Legs: There is  a 2cm long healing sutured laceration left upper thigh as noted on diagram.  There is no drainage from wound.  Area surrounding wound is tender to palpation, and resolving ecchymosis is present.  No erythema, swelling, fluctuance, induration, or warmth.  Patient has pain with resisted adduction of the left hip.  Pain is elicited in the left inguinal area with resisted flexion of the left hip.    Neurological: He is alert and oriented to person, place, and time.  Skin: Skin is warm and dry.    ED Course  Procedures  none     MDM   1. Puncture wound of left thigh, subsequent  encounter    With history of deep puncture wound and persistent left thigh and groin pain, will refer to Dr. Rodney Langtonhomas Thekkekandam for further evaluation and management.    Lattie HawStephen A Beese, MD 03/01/14 [phone]

## 2014-03-01 NOTE — ED Notes (Addendum)
The pt is here for a wound check from his [facility] . He reports increased pain in his LT upper thigh since running out of his pain meds.

## 2014-03-01 NOTE — Discharge Instructions (Signed)
Sutured Wound Care °Sutures are stitches that can be used to close wounds. Wound care helps prevent pain and infection.  °HOME CARE INSTRUCTIONS  °· Rest and elevate the injured area until all the pain and swelling are gone. °· Only take over-the-counter or prescription medicines for pain, discomfort, or fever as directed by your caregiver. °· After 48 hours, gently wash the area with mild soap and water once a Roger, or as directed. Rinse off the soap. Pat the area dry with a clean towel. Do not rub the wound. This may cause bleeding. °· Follow your caregiver's instructions for how often to change the bandage (dressing). Stop using a dressing after 2 days or after the wound stops draining. °· If the dressing sticks, moisten it with soapy water and gently remove it. °· Apply ointment on the wound as directed. °· Avoid stretching a sutured wound. °· Drink enough fluids to keep your urine clear or pale yellow. °· Follow up with your caregiver for suture removal as directed. °· Use sunscreen on your wound for the next 3 to 6 months so the scar will not darken. °SEEK IMMEDIATE MEDICAL CARE IF:  °· Your wound becomes red, swollen, hot, or tender. °· You have increasing pain in the wound. °· You have a red streak that extends from the wound. °· There is pus coming from the wound. °· You have a fever. °· You have shaking chills. °· There is a bad smell coming from the wound. °· You have persistent bleeding from the wound. °MAKE SURE YOU:  °· Understand these instructions. °· Will watch your condition. °· Will get help right away if you are not doing well or get worse. °Document Released: 10/02/2004 Document Revised: 11/17/2011 Document Reviewed: 12/29/2010 °Exit[employer]® Patient Information ©2015 [employer]. This information is not intended to replace advice given to you by your health care provider. Make sure you discuss any questions you have with your health care provider. ° °

## 2015-02-14 ENCOUNTER — Emergency Department (INDEPENDENT_AMBULATORY_CARE_PROVIDER_SITE_OTHER)
Admission: EM | Admit: 2015-02-14 | Discharge: 2015-02-14 | Disposition: A | Discharge status: Home / Self Care | Payer: BLUE CROSS/BLUE SHIELD | Source: Home / Self Care | Attending: Family Medicine | Admitting: Family Medicine

## 2015-02-14 ENCOUNTER — Encounter: Payer: Self-pay | Admitting: *Deleted

## 2015-02-14 DIAGNOSIS — S61211A Laceration without foreign body of left index finger without damage to nail, initial encounter: Secondary | ICD-10-CM

## 2015-02-14 NOTE — Discharge Instructions (Signed)
Wear splint for 4 to 5 days.  Keep wound clean and dry.  Follow instructions in Dermabond information sheet.  Return for signs of infection.   Laceration Care, Adult A laceration is a cut or lesion that goes through all layers of the skin and into the tissue just beneath the skin. TREATMENT  Some lacerations may not require closure. Some lacerations may not be able to be closed due to an increased risk of infection. It is important to see your caregiver as soon as possible after an injury to minimize the risk of infection and maximize the opportunity for successful closure. If closure is appropriate, pain medicines may be given, if needed. The wound will be cleaned to help prevent infection. Your caregiver will use stitches (sutures), staples, wound glue (adhesive), or skin adhesive strips to repair the laceration. These tools bring the skin edges together to allow for faster healing and a better cosmetic outcome. However, all wounds will heal with a scar. Once the wound has healed, scarring can be minimized by covering the wound with sunscreen during the [place] for 1 full year. HOME CARE INSTRUCTIONS  For sutures or staples:  Keep the wound clean and dry.  If you were given a bandage (dressing), you should change it at least once a [place]. Also, change the dressing if it becomes wet or dirty, or as directed by your caregiver.  Wash the wound with soap and water 2 times a [place]. Rinse the wound off with water to remove all soap. Pat the wound dry with a clean towel.  After cleaning, apply a thin layer of the antibiotic ointment as recommended by your caregiver. This will help prevent infection and keep the dressing from sticking.  You may shower as usual after the first 24 hours. Do not soak the wound in water until the sutures are removed.  Only take over-the-counter or prescription medicines for pain, discomfort, or fever as directed by your caregiver.  Get your sutures or staples removed as  directed by your caregiver. For skin adhesive strips:  Keep the wound clean and dry.  Do not get the skin adhesive strips wet. You may bathe carefully, using caution to keep the wound dry.  If the wound gets wet, pat it dry with a clean towel.  Skin adhesive strips will fall off on their own. You may trim the strips as the wound heals. Do not remove skin adhesive strips that are still stuck to the wound. They will fall off in time. For wound adhesive:  You may briefly wet your wound in the shower or bath. Do not soak or scrub the wound. Do not swim. Avoid periods of heavy perspiration until the skin adhesive has fallen off on its own. After showering or bathing, gently pat the wound dry with a clean towel.  Do not apply liquid medicine, cream medicine, or ointment medicine to your wound while the skin adhesive is in place. This may loosen the film before your wound is healed.  If a dressing is placed over the wound, be careful not to apply tape directly over the skin adhesive. This may cause the adhesive to be pulled off before the wound is healed.  Avoid prolonged exposure to sunlight or tanning lamps while the skin adhesive is in place. Exposure to ultraviolet light in the first year will darken the scar.  The skin adhesive will usually remain in place for 5 to 10 days, then naturally fall off the skin. Do not pick at  the adhesive film. You may need a tetanus shot if:  You cannot remember when you had your last tetanus shot.  You have never had a tetanus shot. If you get a tetanus shot, your arm may swell, get red, and feel warm to the touch. This is common and not a problem. If you need a tetanus shot and you choose not to have one, there is a rare chance of getting tetanus. Sickness from tetanus can be serious. SEEK MEDICAL CARE IF:   You have redness, swelling, or increasing pain in the wound.  You see a red line that goes away from the wound.  You have yellowish-white fluid  (pus) coming from the wound.  You have a fever.  You notice a bad smell coming from the wound or dressing.  Your wound breaks open before or after sutures have been removed.  You notice something coming out of the wound such as wood or glass.  Your wound is on your hand or foot and you cannot move a finger or toe. SEEK IMMEDIATE MEDICAL CARE IF:   Your pain is not controlled with prescribed medicine.  You have severe swelling around the wound causing pain and numbness or a change in color in your arm, hand, leg, or foot.  Your wound splits open and starts bleeding.  You have worsening numbness, weakness, or loss of function of any joint around or beyond the wound.  You develop painful lumps near the wound or on the skin anywhere on your body. MAKE SURE YOU:   Understand these instructions.  Will watch your condition.  Will get help right away if you are not doing well or get worse. Document Released: 08/25/2005 Document Revised: 11/17/2011 Document Reviewed: 02/18/2011 [facility] Patient Information 2015 [place], [place]. This information is not intended to replace advice given to you by your health care provider. Make sure you discuss any questions you have with your health care provider.

## 2015-02-14 NOTE — ED Provider Notes (Signed)
CSN: [id number]     Arrival date & time 02/14/15  1503 History   First MD Initiated Contact with Patient 02/14/15 1524     Chief Complaint  Patient presents with  . Extremity Laceration      HPI Comments: Patient was cutting a limb at home yesterday when his saw slipped and cut his left second finger.  He lavaged the wound and applied a bandage.  His last Tdap was 2 years ago.  Patient is a 39 y.o. male presenting with skin laceration. The history is provided by the patient.  Laceration Location:  Finger Finger laceration location:  L index finger Length (cm):  1.5 Depth:  Through dermis Quality: straight   Bleeding: controlled   Time since incident:  1 [place] Laceration mechanism:  Metal edge Pain details:    Quality:  Aching   Severity:  Mild   Timing:  Constant   Progression:  Improving Foreign body present:  No foreign bodies Relieved by:  Pressure Worsened by:  Movement Tetanus status:  Up to date   History reviewed. No pertinent past medical history. History reviewed. No pertinent past surgical history. Family History  Problem Relation Age of Onset  . Heart disease Father    History  Substance Use Topics  . Smoking status: Current Every [place] Smoker -- 0.50 packs/[place] for 5 years  . Smokeless tobacco: Not on file  . Alcohol Use: No    Review of Systems  All other systems reviewed and are negative.   Allergies  Review of patient's allergies indicates no known allergies.  Home Medications   Prior to Admission medications   Medication Sig Start Date End Date Taking? Authorizing Provider  cephALEXin (KEFLEX) 500 MG capsule Take 1 capsule (500 mg total) by mouth 3 (three) times daily. 03/01/14   Monica Bectonhomas J Thekkekandam, MD  HYDROcodone-acetaminophen (NORCO/VICODIN) 5-325 MG per tablet Take one by mouth at bedtime as needed for pain 04/18/13   Lattie HawStephen A Gerrard Crystal, MD  meloxicam (MOBIC) 15 MG tablet One tab PO qAM with breakfast for 2 weeks, then daily prn pain. 03/01/14    Monica Bectonhomas J Thekkekandam, MD  OxyCODONE HCl 7.5 MG TABA Take 1 tablet by mouth every 8 (eight) hours as needed. 03/01/14   Monica Bectonhomas J Thekkekandam, MD  predniSONE (DELTASONE) 20 MG tablet Take 1 tablet (20 mg total) by mouth 2 (two) times daily. 04/18/13   Lattie HawStephen A Lynniah Janoski, MD  UNKNOWN TO PATIENT     Historical Provider, MD   BP 105/76 mmHg  Pulse 89  Temp(Src) 98 F (36.7 C) (Oral)  Resp 16  Ht 6' (1.829 m)  Wt 142 lb (64.411 kg)  BMI 19.25 kg/m2  SpO2 100% Physical Exam  Constitutional: He appears well-developed and well-nourished. No distress.  HENT:  Head: Atraumatic.  Eyes: Pupils are equal, round, and reactive to light.  Musculoskeletal:       Hands: There is a 1.5cm long simple superficial laceration over radial aspect of left second finger proximal phalanx just distal to the MCP joint.  All finger joints have flexion/extension intact.  Distal neurovascular function is intact.   Neurological: He is alert.  Skin: Skin is warm and dry.  Nursing note and vitals reviewed.   ED Course  Procedures  Laceration Repair (Dermabond) Discussed benefits and risks of procedure and verbal consent obtained. Using sterile technique, cleansed wound with Betadine followed by copious lavage with normal saline.  Wound carefully inspected for debris and foreign bodies; none found.  Wound edges  carefully approximated in normal anatomic position and closed with Dermabond.  Wound precautions explained to patient.     MDM   1. Laceration of second finger, left, initial encounter    Patient requests laceration repair with Dermabond rather than sutures. Finger splint applied. Wear splint for 4 to 5 days.  Keep wound clean and dry.  Follow instructions in Dermabond information sheet.  Return for signs of infection.    Lattie Haw, MD 02/19/15 1013

## 2015-02-14 NOTE — ED Notes (Addendum)
Pt c/o LT 2nd finger laceration x 7pm last night. He reports he was cutting a limb at home when the saw slipped and cut his finger. He reports LOC after injury from the site of blood. Reports Tdap 2 years ago.

## 2015-02-22 IMAGING — CR DG THORACIC SPINE 2V
6 series · 6 of 6 positions shown · non-contrast
Comparison: No

CLINICAL DATA: Pain post trauma

THORACIC SPINE - 3 VIEW

[view not recorded (1 of 6)]
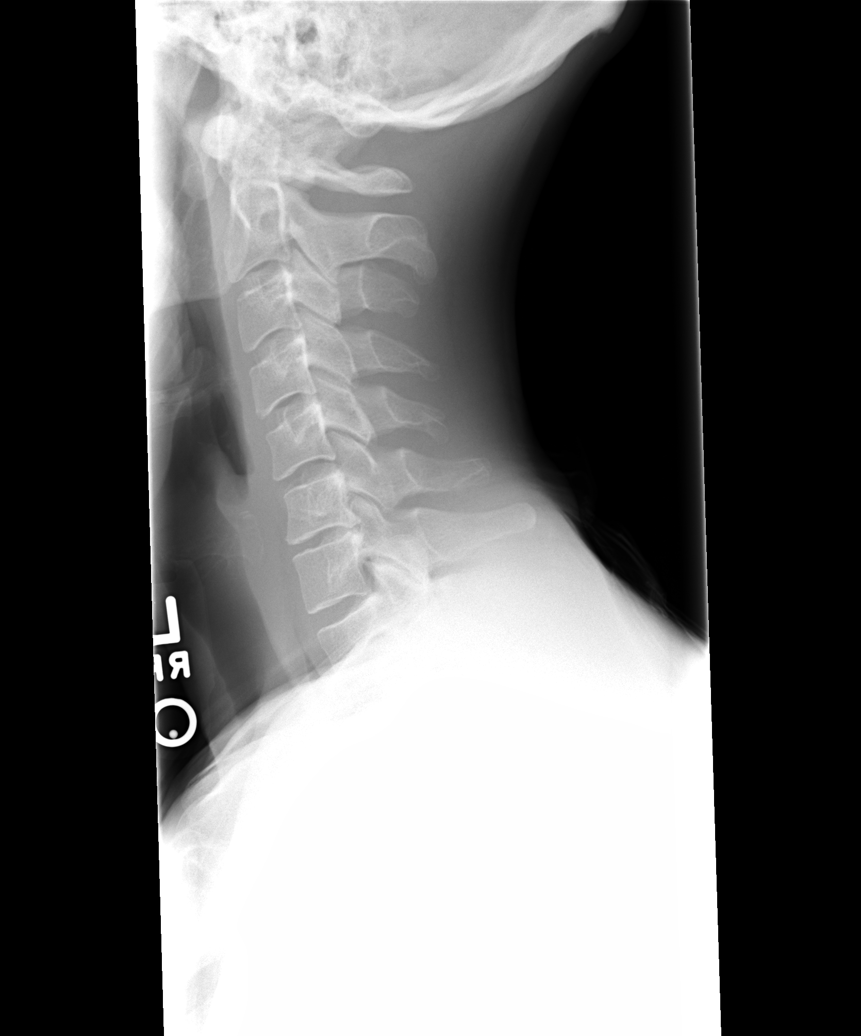

[view not recorded (2 of 6)]
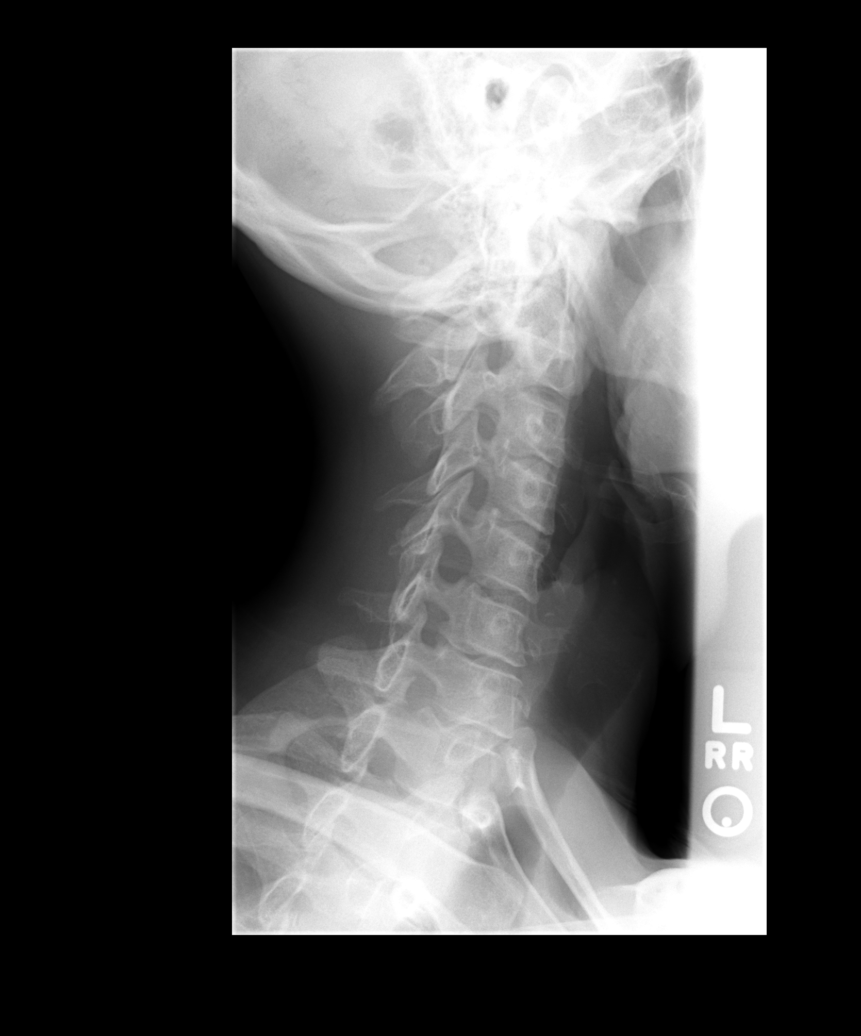

[view not recorded (3 of 6)]
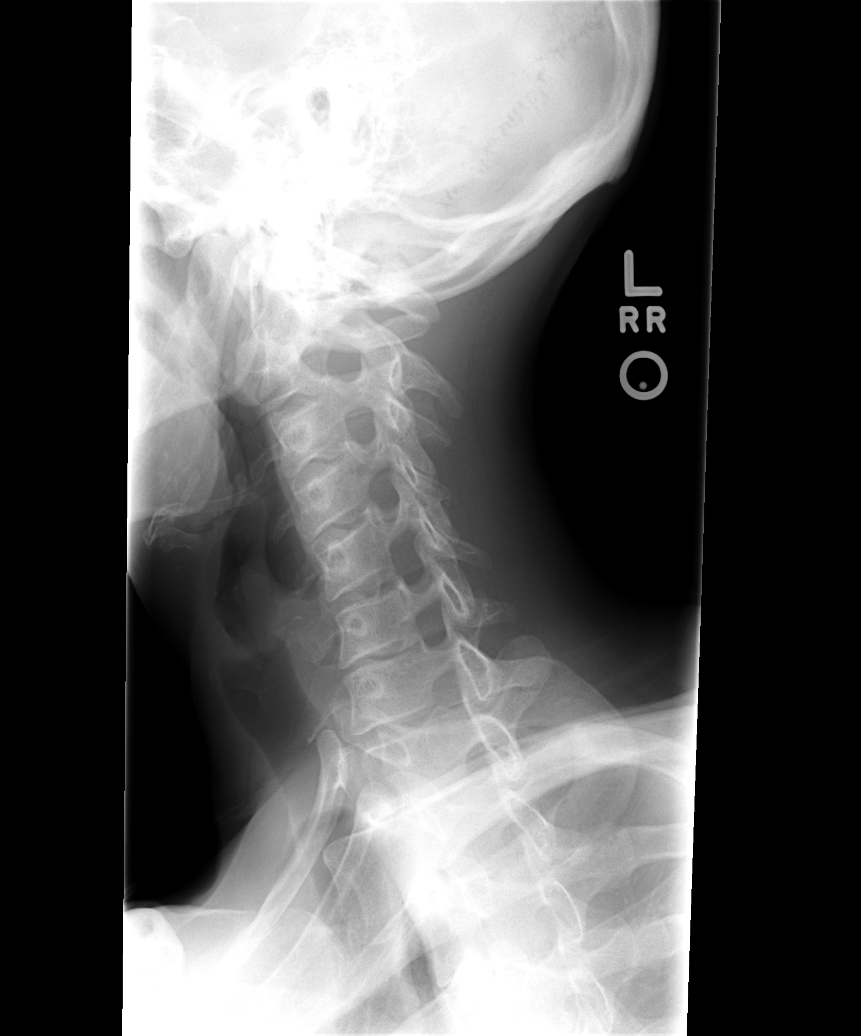

[view not recorded (4 of 6)]
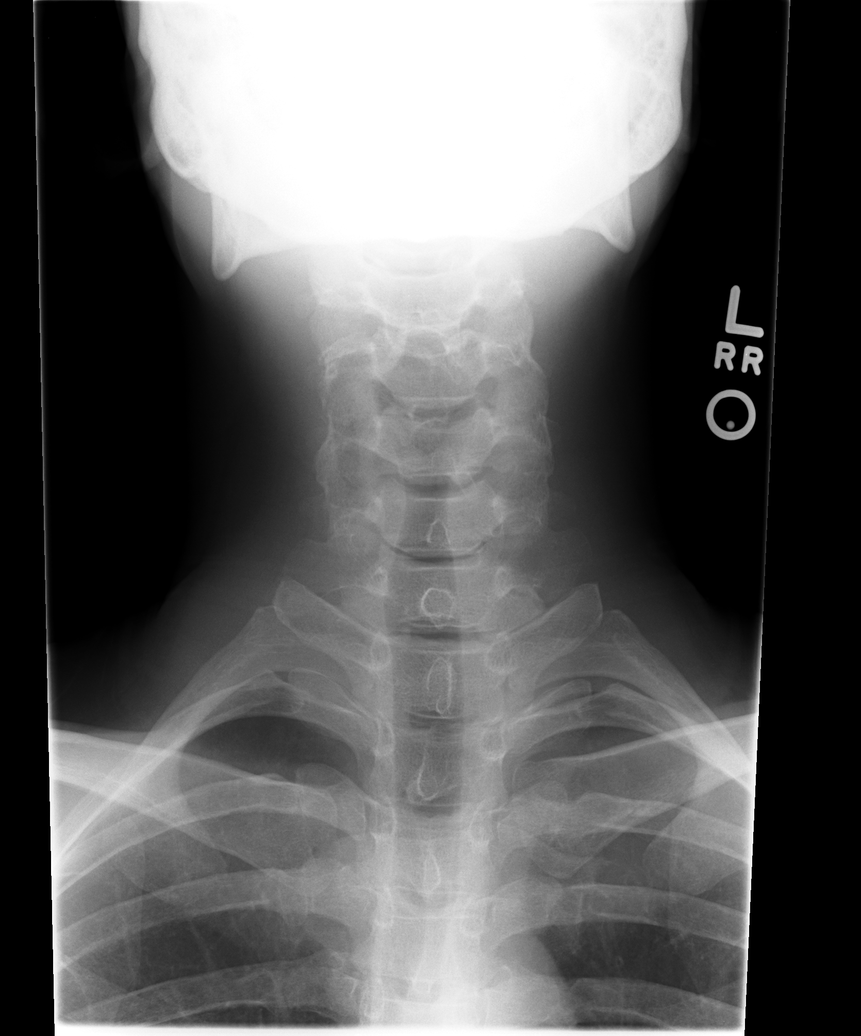

[view not recorded (5 of 6)]
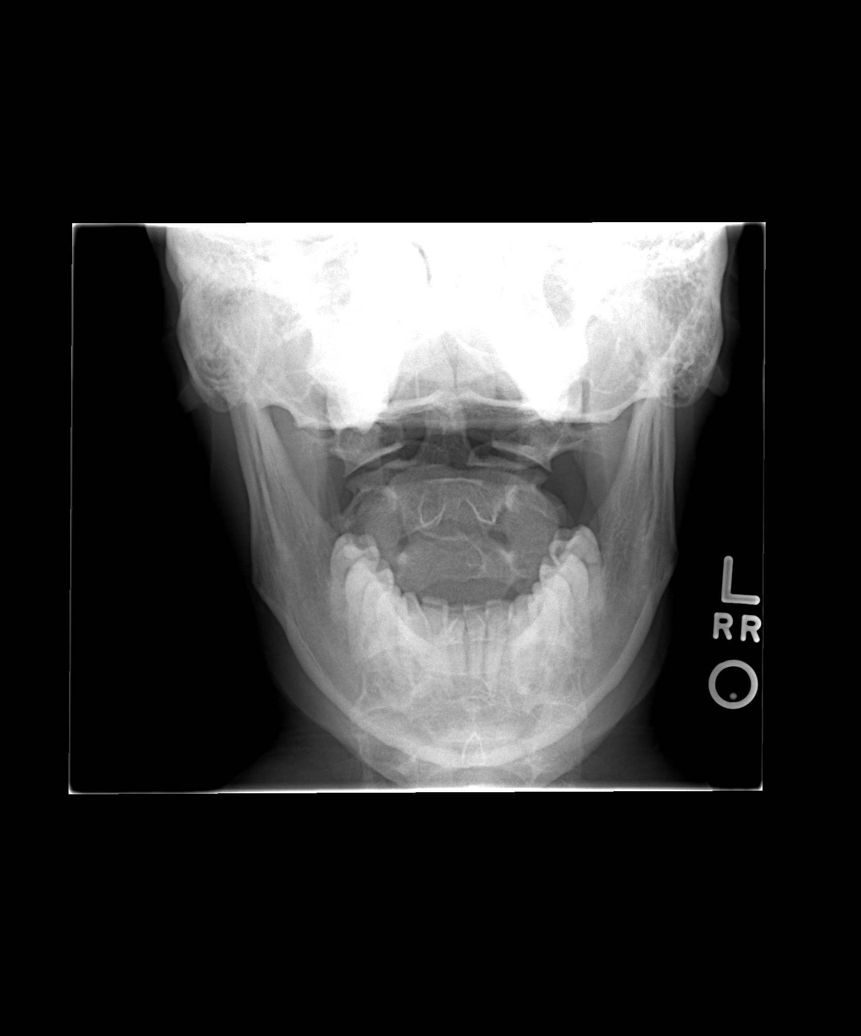

[view not recorded (6 of 6)]
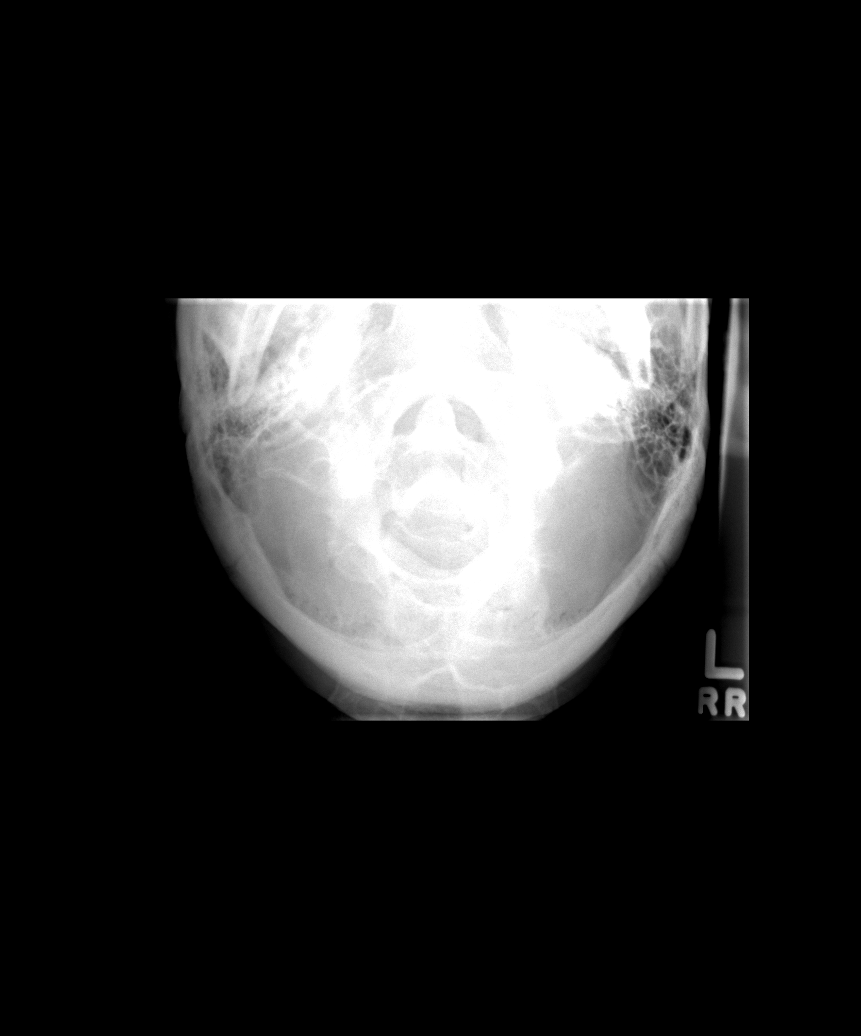

[6 of 6 positions shown; findings below may reference images not displayed]

FINDINGS: Frontal, lateral, and swimmer's views were obtained.
There is thoracolumbar levoscoliosis.  There is no fracture or
spondylolisthesis.  There is disc space narrowing in the lower
cervical spine at several levels.
IMPRESSION: Scoliosis and osteoarthritic change in the lower thoracic region.
No fracture or spondylolisthesis.

## 2015-05-21 ENCOUNTER — Encounter: Payer: Self-pay | Admitting: *Deleted

## 2015-05-21 ENCOUNTER — Emergency Department (INDEPENDENT_AMBULATORY_CARE_PROVIDER_SITE_OTHER)
Admission: EM | Admit: 2015-05-21 | Discharge: 2015-05-21 | Disposition: A | Discharge status: Home / Self Care | Payer: BLUE CROSS/BLUE SHIELD | Source: Home / Self Care | Attending: Family Medicine | Admitting: Family Medicine

## 2015-05-21 DIAGNOSIS — S61412A Laceration without foreign body of left hand, initial encounter: Secondary | ICD-10-CM

## 2015-05-21 MED ORDER — HYDROCODONE-ACETAMINOPHEN 5-325 MG PO TABS
1.0000 | ORAL_TABLET | Freq: Four times a day (QID) | ORAL | Status: AC | PRN
Start: 2015-05-21 — End: ?

## 2015-05-21 NOTE — Discharge Instructions (Signed)
Change dressing daily and apply Bacitracin ointment to wound.  Keep wound clean and dry.  Return for any signs of infection (or follow-up with family doctor):  Increasing redness, swelling, pain, heat, drainage, etc. °Return in 10 days for suture removal.   ° ° °Laceration Care, Adult °A laceration is a cut or lesion that goes through all layers of the skin and into the tissue just beneath the skin. °TREATMENT  °Some lacerations may not require closure. Some lacerations may not be able to be closed due to an increased risk of infection. It is important to see your caregiver as soon as possible after an injury to minimize the risk of infection and maximize the opportunity for successful closure. °If closure is appropriate, pain medicines may be given, if needed. The wound will be cleaned to help prevent infection. Your caregiver will use stitches (sutures), staples, wound glue (adhesive), or skin adhesive strips to repair the laceration. These tools bring the skin edges together to allow for faster healing and a better cosmetic outcome. However, all wounds will heal with a scar. Once the wound has healed, scarring can be minimized by covering the wound with sunscreen during the [place] for 1 full year. °HOME CARE INSTRUCTIONS  °For sutures or staples: °· Keep the wound clean and dry. °· If you were given a bandage (dressing), you should change it at least once a [place]. Also, change the dressing if it becomes wet or dirty, or as directed by your caregiver. °· Wash the wound with soap and water 2 times a [place]. Rinse the wound off with water to remove all soap. Pat the wound dry with a clean towel. °· After cleaning, apply a thin layer of the antibiotic ointment as recommended by your caregiver. This will help prevent infection and keep the dressing from sticking. °· You may shower as usual after the first 24 hours. Do not soak the wound in water until the sutures are removed. °· Only take over-the-counter or prescription  medicines for pain, discomfort, or fever as directed by your caregiver. °· Get your sutures or staples removed as directed by your caregiver. °For skin adhesive strips: °· Keep the wound clean and dry. °· Do not get the skin adhesive strips wet. You may bathe carefully, using caution to keep the wound dry. °· If the wound gets wet, pat it dry with a clean towel. °· Skin adhesive strips will fall off on their own. You may trim the strips as the wound heals. Do not remove skin adhesive strips that are still stuck to the wound. They will fall off in time. °For wound adhesive: °· You may briefly wet your wound in the shower or bath. Do not soak or scrub the wound. Do not swim. Avoid periods of heavy perspiration until the skin adhesive has fallen off on its own. After showering or bathing, gently pat the wound dry with a clean towel. °· Do not apply liquid medicine, cream medicine, or ointment medicine to your wound while the skin adhesive is in place. This may loosen the film before your wound is healed. °· If a dressing is placed over the wound, be careful not to apply tape directly over the skin adhesive. This may cause the adhesive to be pulled off before the wound is healed. °· Avoid prolonged exposure to sunlight or tanning lamps while the skin adhesive is in place. Exposure to ultraviolet light in the first year will darken the scar. °· The skin adhesive will usually   remain in place for 5 to 10 days, then naturally fall off the skin. Do not pick at the adhesive film. °You may need a tetanus shot if: °· You cannot remember when you had your last tetanus shot. °· You have never had a tetanus shot. °If you get a tetanus shot, your arm may swell, get red, and feel warm to the touch. This is common and not a problem. If you need a tetanus shot and you choose not to have one, there is a rare chance of getting tetanus. Sickness from tetanus can be serious. °SEEK MEDICAL CARE IF:  °· You have redness, swelling, or  increasing pain in the wound. °· You see a red line that goes away from the wound. °· You have yellowish-white fluid (pus) coming from the wound. °· You have a fever. °· You notice a bad smell coming from the wound or dressing. °· Your wound breaks open before or after sutures have been removed. °· You notice something coming out of the wound such as wood or glass. °· Your wound is on your hand or foot and you cannot move a finger or toe. °SEEK IMMEDIATE MEDICAL CARE IF:  °· Your pain is not controlled with prescribed medicine. °· You have severe swelling around the wound causing pain and numbness or a change in color in your arm, hand, leg, or foot. °· Your wound splits open and starts bleeding. °· You have worsening numbness, weakness, or loss of function of any joint around or beyond the wound. °· You develop painful lumps near the wound or on the skin anywhere on your body. °MAKE SURE YOU:  °· Understand these instructions. °· Will watch your condition. °· Will get help right away if you are not doing well or get worse. °Document Released: 08/25/2005 Document Revised: 11/17/2011 Document Reviewed: 02/18/2011 °ExitCare® Patient Information ©2015 Exit[employer], LLC. This information is not intended to replace advice given to you by your health care provider. Make sure you discuss any questions you have with your health care provider. ° °

## 2015-05-21 NOTE — ED Notes (Signed)
Pt was cutting a piece of pipe with a saw blade about 30 minutes ago, causing laceration to his left hand. Cleaned with Hibiclens. Reports tetanus is UTD, 1 year ago.

## 2015-05-21 NOTE — ED Provider Notes (Signed)
CSN: [id number]     Arrival date & time 05/21/15  1145 History   First MD Initiated Contact with Patient 05/21/15 1258     Chief Complaint  Patient presents with  . Extremity Laceration      HPI Comments: Patient lacerated the palm of his left hand 30 minutes ago with a saw blade while cutting pipe.  His last Tdap was one year ago.  Patient is a 39 y.o. male presenting with skin laceration. The history is provided by the patient and the spouse.  Laceration Location:  Hand Hand laceration location:  L hand Length (cm):  1.5 Depth:  Through dermis Bleeding: controlled   Time since incident:  30 minutes Injury mechanism: hack saw blade. Pain details:    Quality:  Aching   Severity:  Mild   Timing:  Constant   Progression:  Unchanged Foreign body present:  No foreign bodies Relieved by:  Pressure Worsened by:  Movement Ineffective treatments:  None tried Tetanus status:  Up to date   History reviewed. No pertinent past medical history. History reviewed. No pertinent past surgical history. Family History  Problem Relation Age of Onset  . Heart disease Father    Social History  Substance Use Topics  . Smoking status: Current Every [place] Smoker -- 0.50 packs/[place] for 5 years  . Smokeless tobacco: None  . Alcohol Use: No    Review of Systems  All other systems reviewed and are negative.   Allergies  Review of patient's allergies indicates no known allergies.  Home Medications   Prior to Admission medications   Medication Sig Start Date End Date Taking? Authorizing Provider  HYDROcodone-acetaminophen (NORCO/VICODIN) 5-325 MG per tablet Take 1 tablet by mouth every 6 (six) hours as needed for moderate pain. 05/21/15   Lattie Haw, MD  meloxicam (MOBIC) 15 MG tablet One tab PO qAM with breakfast for 2 weeks, then daily prn pain. 03/01/14   Monica Becton, MD  UNKNOWN TO PATIENT     Historical Provider, MD   Meds Ordered and Administered this Visit  Medications -  No data to display  BP 110/66 mmHg  Pulse 72  Resp 16  Wt 145 lb (65.772 kg) No data found.   Physical Exam  Constitutional: He is oriented to person, place, and time. He appears well-developed and well-nourished. No distress.  HENT:  Head: Atraumatic.  Eyes: Pupils are equal, round, and reactive to light.  Pulmonary/Chest: No respiratory distress.  Musculoskeletal:       Left hand: He exhibits tenderness and laceration. He exhibits normal range of motion, no bony tenderness, normal two-point discrimination, normal capillary refill, no deformity and no swelling.       Hands: There is a simple 1.5cm long linear laceration in the web space between first and second fingers as noted on diagram.    Neurological: He is alert and oriented to person, place, and time.  Skin: Skin is warm and dry.  Nursing note and vitals reviewed.   ED Course  Procedures  Laceration Repair Discussed benefits and risks of procedure and verbal consent obtained. Using sterile technique and local 1% lidocaine without epinephrine, cleansed wound with Betadine followed by copious lavage with normal saline.  Wound carefully inspected for debris and foreign bodies; none found.  Wound closed with #4, 4-0 interrupted nylon sutures.  Bacitracin and non-stick sterile dressing applied.  Wound precautions explained to patient.  Return for suture removal in 10 days.     MDM  1. Laceration of left hand, initial encounter      Change dressing daily and apply Bacitracin ointment to wound.  Keep wound clean and dry.  Return for any signs of infection (or follow-up with family doctor):  Increasing redness, swelling, pain, heat, drainage, etc. Return in 10 days for suture removal.      Lattie Haw, MD 05/28/15 [phone]

## 2015-05-31 ENCOUNTER — Emergency Department (INDEPENDENT_AMBULATORY_CARE_PROVIDER_SITE_OTHER)
Admission: EM | Admit: 2015-05-31 | Discharge: 2015-05-31 | Disposition: A | Discharge status: Home / Self Care | Payer: BLUE CROSS/BLUE SHIELD | Source: Home / Self Care | Attending: Family Medicine | Admitting: Family Medicine

## 2015-05-31 ENCOUNTER — Encounter: Payer: Self-pay | Admitting: *Deleted

## 2015-05-31 DIAGNOSIS — Z4802 Encounter for removal of sutures: Secondary | ICD-10-CM

## 2015-05-31 NOTE — Discharge Instructions (Signed)
Suture Removal, Care After °Refer to this sheet in the next few weeks. These instructions provide you with information on caring for yourself after your procedure. Your health care provider may also give you more specific instructions. Your treatment has been planned according to current medical practices, but problems sometimes occur. Call your health care provider if you have any problems or questions after your procedure. °WHAT TO EXPECT AFTER THE PROCEDURE °After your stitches (sutures) are removed, it is typical to have the following: °· Some discomfort and swelling in the wound area. °· Slight redness in the area. °HOME CARE INSTRUCTIONS  °· If you have skin adhesive strips over the wound area, do not take the strips off. They will fall off on their own in a few days. If the strips remain in place after 14 days, you may remove them. °· Change any bandages (dressings) at least once a [place] or as directed by your health care provider. If the bandage sticks, soak it off with warm, soapy water. °· Apply cream or ointment only as directed by your health care provider. If using cream or ointment, wash the area with soap and water 2 times a [place] to remove all the cream or ointment. Rinse off the soap and pat the area dry with a clean towel. °· Keep the wound area dry and clean. If the bandage becomes wet or dirty, or if it develops a bad smell, change it as soon as possible. °· Continue to protect the wound from injury. °· Use sunscreen when out in the sun. New scars become sunburned easily. °SEEK MEDICAL CARE IF: °· You have increasing redness, swelling, or pain in the wound. °· You see pus coming from the wound. °· You have a fever. °· You notice a bad smell coming from the wound or dressing. °· Your wound breaks open (edges not staying together). °Document Released: 05/20/2001 Document Revised: 06/15/2013 Document Reviewed: 04/06/2013 °ExitCare® Patient Information ©2015 ExitCare, LLC. This information is not  intended to replace advice given to you by your health care provider. Make sure you discuss any questions you have with your health care provider. ° °

## 2015-05-31 NOTE — ED Provider Notes (Signed)
CSN: [id number]     Arrival date & time 05/31/15  0803 History   First MD Initiated Contact with Patient 05/31/15 0830     Chief Complaint  Patient presents with  . Suture / Staple Removal     HPI Comments: Patient returns for suture removal left hand without complaints.  The history is provided by the patient.    History reviewed. No pertinent past medical history. History reviewed. No pertinent past surgical history. Family History  Problem Relation Age of Onset  . Heart disease Father    Social History  Substance Use Topics  . Smoking status: Current Every [place] Smoker -- 0.50 packs/[place] for 5 years  . Smokeless tobacco: None  . Alcohol Use: No    Review of Systems No fevers, chills, and sweats.  No pain or swelling Allergies  Review of patient's allergies indicates no known allergies.  Home Medications   Prior to Admission medications   Medication Sig Start Date End Date Taking? Authorizing Provider  HYDROcodone-acetaminophen (NORCO/VICODIN) 5-325 MG per tablet Take 1 tablet by mouth every 6 (six) hours as needed for moderate pain. 05/21/15   Lattie Haw, MD  meloxicam (MOBIC) 15 MG tablet One tab PO qAM with breakfast for 2 weeks, then daily prn pain. 03/01/14   Monica Becton, MD  UNKNOWN TO PATIENT     Historical Provider, MD   Meds Ordered and Administered this Visit  Medications - No data to display  BP 101/73 mmHg  Pulse 88  Temp(Src) 98.4 F (36.9 C) No data found.   Physical Exam Left hand:  Well healed laceration without tenderness to palpation, drainage, swelling, or erythema.  Removed four sutures ED Course  Procedures  none  MDM   1. Visit for suture removal:  Well healed wound.  No evidence infection    Advised to use caution as scar tissue matures.    Lattie Haw, MD 05/31/15 [phone]

## 2015-05-31 NOTE — ED Notes (Signed)
Suture removal from left hand. No s/s infection
